# Patient Record
Sex: Female | Born: 1975 | Race: Asian | Hispanic: No | Marital: Married | Smoking: Never smoker
Health system: Southern US, Community
[De-identification: ages and names within clinical notes are randomized; demographics above are authoritative.]

## PROBLEM LIST (undated history)

## (undated) DIAGNOSIS — E282 Polycystic ovarian syndrome: Secondary | ICD-10-CM

## (undated) DIAGNOSIS — O24419 Gestational diabetes mellitus in pregnancy, unspecified control: Secondary | ICD-10-CM

## (undated) HISTORY — PX: OTHER SURGICAL HISTORY: SHX169

## (undated) HISTORY — DX: Polycystic ovarian syndrome: E28.2

## (undated) HISTORY — DX: Gestational diabetes mellitus in pregnancy, unspecified control: O24.419

---

## 2013-05-14 NOTE — L&D Delivery Note (Signed)
Delivery Note At 11:10 AM a viable and healthy female was delivered via Vaginal, Spontaneous Delivery (Presentation: ; [place] ).  APGAR: 8, 9; weight pending .   Placenta status: spontaneous, intact  Cord:  with the following complications: [place] x 2 reduced at delivery.  Cord pH: na  Anesthesia:  local Episiotomy: midline( done for fetal bradycardia) Lacerations: none Suture Repair: 2.0 vicryl Est. Blood Loss (mL): 300  Mom to postpartum.  Baby to Couplet care / Skin to Skin.  Roseann Kees J 02/11/2014, 11:37 AM

## 2013-07-22 LAB — OB RESULTS CONSOLE HEPATITIS B SURFACE ANTIGEN: HEP B S AG: NEGATIVE

## 2013-07-22 LAB — OB RESULTS CONSOLE RUBELLA ANTIBODY, IGM: RUBELLA: IMMUNE

## 2013-07-22 LAB — OB RESULTS CONSOLE GC/CHLAMYDIA
Chlamydia: NEGATIVE
Gonorrhea: NEGATIVE

## 2013-07-22 LAB — OB RESULTS CONSOLE RPR: RPR: NONREACTIVE

## 2013-07-22 LAB — OB RESULTS CONSOLE HIV ANTIBODY (ROUTINE TESTING): HIV: NONREACTIVE

## 2013-07-22 LAB — OB RESULTS CONSOLE ABO/RH: RH Type: POSITIVE

## 2013-07-22 LAB — OB RESULTS CONSOLE ANTIBODY SCREEN: Antibody Screen: NEGATIVE

## 2013-09-09 ENCOUNTER — Encounter: Payer: BC Managed Care – PPO | Attending: Obstetrics & Gynecology

## 2013-09-09 VITALS — Ht 62.5 in | Wt 146.5 lb

## 2013-09-09 DIAGNOSIS — O99810 Abnormal glucose complicating pregnancy: Secondary | ICD-10-CM | POA: Insufficient documentation

## 2013-09-09 DIAGNOSIS — Z713 Dietary counseling and surveillance: Secondary | ICD-10-CM | POA: Insufficient documentation

## 2013-09-10 NOTE — Progress Notes (Signed)
  Patient was seen on 09/09/13 for Gestational Diabetes self-management class at the [facility]. The following learning objectives were met by the patient during this course:   States the definition of Gestational Diabetes  States why dietary management is important in controlling blood glucose  Describes the effects of carbohydrates on blood glucose levels  Demonstrates ability to create a balanced meal plan  Demonstrates carbohydrate counting   States when to check blood glucose levels  Demonstrates proper blood glucose monitoring techniques  States the effect of stress and exercise on blood glucose levels  States the importance of limiting caffeine and abstaining from alcohol and smoking  Plan:  Aim for 2 Carb Choices per meal (30 grams) +/- 1 either way for breakfast Aim for 3 Carb Choices per meal (45 grams) +/- 1 either way from lunch and dinner Aim for 1-2 Carbs per snack Begin reading food labels for Total Carbohydrate and sugar grams of foods Consider  increasing your activity level by walking daily as tolerated Begin checking BG before breakfast and 1-2 hours after first bit of breakfast, lunch and dinner after  as directed by MD  Take medication  as directed by MD  Blood glucose monitor given:  One Touch Ultra Mini Self Monitoring Kit Lot # [id number] X Exp: 02/2014 Blood glucose reading: 53mdl  Patient instructed to monitor glucose levels: FBS: 60 - <90 2 hour: <120  Patient received the following handouts:  Nutrition Diabetes and Pregnancy  Carbohydrate Counting List  Meal Planning worksheet  Patient will be seen for follow-up as needed.

## 2013-10-26 ENCOUNTER — Inpatient Hospital Stay (HOSPITAL_COMMUNITY): Admission: AD | Admit: 2013-10-26 | Payer: Self-pay | Source: Ambulatory Visit | Admitting: Obstetrics & Gynecology

## 2014-01-12 LAB — OB RESULTS CONSOLE GBS: GBS: NEGATIVE

## 2014-01-13 ENCOUNTER — Other Ambulatory Visit: Payer: Self-pay | Admitting: Obstetrics & Gynecology

## 2014-01-27 ENCOUNTER — Inpatient Hospital Stay (HOSPITAL_COMMUNITY): Admission: RE | Admit: 2014-01-27 | Payer: BC Managed Care – PPO | Source: Ambulatory Visit

## 2014-01-29 ENCOUNTER — Inpatient Hospital Stay (HOSPITAL_COMMUNITY)
Admission: RE | Admit: 2014-01-29 | Payer: BC Managed Care – PPO | Source: Ambulatory Visit | Admitting: Obstetrics & Gynecology

## 2014-01-29 ENCOUNTER — Encounter (HOSPITAL_COMMUNITY): Admission: RE | Payer: Self-pay | Source: Ambulatory Visit

## 2014-01-29 SURGERY — Surgical Case
Anesthesia: Spinal

## 2014-02-03 ENCOUNTER — Encounter (HOSPITAL_COMMUNITY): Payer: Self-pay | Admitting: *Deleted

## 2014-02-03 ENCOUNTER — Telehealth (HOSPITAL_COMMUNITY): Payer: Self-pay | Admitting: *Deleted

## 2014-02-03 ENCOUNTER — Other Ambulatory Visit: Payer: Self-pay | Admitting: Obstetrics & Gynecology

## 2014-02-03 NOTE — Telephone Encounter (Signed)
Preadmission screen  

## 2014-02-09 NOTE — Telephone Encounter (Signed)
Interpreter number [phone]

## 2014-02-10 ENCOUNTER — Inpatient Hospital Stay (HOSPITAL_COMMUNITY): Payer: BC Managed Care – PPO

## 2014-02-10 ENCOUNTER — Inpatient Hospital Stay (HOSPITAL_COMMUNITY)
Admission: AD | Admit: 2014-02-10 | Discharge: 2014-02-10 | Disposition: A | Discharge status: Home / Self Care | Payer: BC Managed Care – PPO | Source: Ambulatory Visit | Attending: Obstetrics | Admitting: Obstetrics

## 2014-02-10 ENCOUNTER — Encounter (HOSPITAL_COMMUNITY): Payer: Self-pay | Admitting: *Deleted

## 2014-02-10 DIAGNOSIS — O99810 Abnormal glucose complicating pregnancy: Secondary | ICD-10-CM | POA: Diagnosis not present

## 2014-02-10 DIAGNOSIS — Z87891 Personal history of nicotine dependence: Secondary | ICD-10-CM | POA: Diagnosis not present

## 2014-02-10 DIAGNOSIS — O99891 Other specified diseases and conditions complicating pregnancy: Secondary | ICD-10-CM | POA: Insufficient documentation

## 2014-02-10 DIAGNOSIS — N898 Other specified noninflammatory disorders of vagina: Secondary | ICD-10-CM | POA: Insufficient documentation

## 2014-02-10 DIAGNOSIS — O09529 Supervision of elderly multigravida, unspecified trimester: Secondary | ICD-10-CM | POA: Diagnosis not present

## 2014-02-10 DIAGNOSIS — O9989 Other specified diseases and conditions complicating pregnancy, childbirth and the puerperium: Principal | ICD-10-CM

## 2014-02-10 DIAGNOSIS — O4690 Antepartum hemorrhage, unspecified, unspecified trimester: Secondary | ICD-10-CM

## 2014-02-10 NOTE — Discharge Instructions (Signed)
Braxton Hicks Contractions Contractions of the uterus can occur throughout pregnancy. Contractions are not always a sign that you are in labor.  WHAT ARE BRAXTON HICKS CONTRACTIONS?  Contractions that occur before labor are called Braxton Hicks contractions, or false labor. Toward the end of pregnancy (32-34 weeks), these contractions can develop more often and may become more forceful. This is not true labor because these contractions do not result in opening (dilatation) and thinning of the cervix. They are sometimes difficult to tell apart from true labor because these contractions can be forceful and people have different pain tolerances. You should not feel embarrassed if you go to the hospital with false labor. Sometimes, the only way to tell if you are in true labor is for your health care provider to look for changes in the cervix. If there are no prenatal problems or other health problems associated with the pregnancy, it is completely safe to be sent home with false labor and await the onset of true labor. HOW CAN YOU TELL THE DIFFERENCE BETWEEN TRUE AND FALSE LABOR? False Labor  The contractions of false labor are usually shorter and not as hard as those of true labor.   The contractions are usually irregular.   The contractions are often felt in the front of the lower abdomen and in the groin.   The contractions may go away when you walk around or change positions while lying down.   The contractions get weaker and are shorter lasting as time goes on.   The contractions do not usually become progressively stronger, regular, and closer together as with true labor.  True Labor  Contractions in true labor last 30-70 seconds, become very regular, usually become more intense, and increase in frequency.   The contractions do not go away with walking.   The discomfort is usually felt in the top of the uterus and spreads to the lower abdomen and low back.   True labor can be  determined by your health care provider with an exam. This will show that the cervix is dilating and getting thinner.  WHAT TO REMEMBER  Keep up with your usual exercises and follow other instructions given by your health care provider.   Take medicines as directed by your health care provider.   Keep your regular prenatal appointments.   Eat and drink lightly if you think you are going into labor.   If Braxton Hicks contractions are making you uncomfortable:   Change your position from lying down or resting to walking, or from walking to resting.   Sit and rest in a tub of warm water.   Drink 2-3 glasses of water. Dehydration may cause these contractions.   Do slow and deep breathing several times an hour.  WHEN SHOULD I SEEK IMMEDIATE MEDICAL CARE? Seek immediate medical care if:  Your contractions become stronger, more regular, and closer together.   You have fluid leaking or gushing from your vagina.   You have a fever.   You pass blood-tinged mucus.   You have vaginal bleeding.   You have continuous abdominal pain.   You have low back pain that you never had before.   You feel your baby's head pushing down and causing pelvic pressure.   Your baby is not moving as much as it used to.  Document Released: 04/30/2005 Document Revised: 05/05/2013 Document Reviewed: 02/09/2013 ExitCare Patient Information 2015 [employer]. This information is not intended to replace advice given to you by your health care   provider. Make sure you discuss any questions you have with your health care provider.  Fetal Movement Counts Patient Name: __________________________________________________ Patient Due Date: ____________________ Performing a fetal movement count is highly recommended in high-risk pregnancies, but it is good for every pregnant woman to do. Your health care provider may ask you to start counting fetal movements at 28 weeks of the pregnancy. Fetal  movements often increase:  After eating a full meal.  After physical activity.  After eating or drinking something sweet or cold.  At rest. Pay attention to when you feel the baby is most active. This will help you notice a pattern of your baby's sleep and wake cycles and what factors contribute to an increase in fetal movement. It is important to perform a fetal movement count at the same time each day when your baby is normally most active.  HOW TO COUNT FETAL MOVEMENTS 1. Find a quiet and comfortable area to sit or lie down on your left side. Lying on your left side provides the best blood and oxygen circulation to your baby. 2. Write down the day and time on a sheet of paper or in a journal. 3. Start counting kicks, flutters, swishes, rolls, or jabs in a 2-hour period. You should feel at least 10 movements within 2 hours. 4. If you do not feel 10 movements in 2 hours, wait 2-3 hours and count again. Look for a change in the pattern or not enough counts in 2 hours. SEEK MEDICAL CARE IF:  You feel less than 10 counts in 2 hours, tried twice.  There is no movement in over an hour.  The pattern is changing or taking longer each day to reach 10 counts in 2 hours.  You feel the baby is not moving as he or she usually does. Date: ____________ Movements: ____________ Start time: ____________ Finish time: ____________  Date: ____________ Movements: ____________ Start time: ____________ Finish time: ____________ Date: ____________ Movements: ____________ Start time: ____________ Finish time: ____________ Date: ____________ Movements: ____________ Start time: ____________ Finish time: ____________ Date: ____________ Movements: ____________ Start time: ____________ Finish time: ____________ Date: ____________ Movements: ____________ Start time: ____________ Finish time: ____________ Date: ____________ Movements: ____________ Start time: ____________ Finish time: ____________ Date: ____________  Movements: ____________ Start time: ____________ Finish time: ____________  Date: ____________ Movements: ____________ Start time: ____________ Finish time: ____________ Date: ____________ Movements: ____________ Start time: ____________ Finish time: ____________ Date: ____________ Movements: ____________ Start time: ____________ Finish time: ____________ Date: ____________ Movements: ____________ Start time: ____________ Finish time: ____________ Date: ____________ Movements: ____________ Start time: ____________ Finish time: ____________ Date: ____________ Movements: ____________ Start time: ____________ Finish time: ____________ Date: ____________ Movements: ____________ Start time: ____________ Finish time: ____________  Date: ____________ Movements: ____________ Start time: ____________ Finish time: ____________ Date: ____________ Movements: ____________ Start time: ____________ Finish time: ____________ Date: ____________ Movements: ____________ Start time: ____________ Finish time: ____________ Date: ____________ Movements: ____________ Start time: ____________ Finish time: ____________ Date: ____________ Movements: ____________ Start time: ____________ Finish time: ____________ Date: ____________ Movements: ____________ Start time: ____________ Finish time: ____________ Date: ____________ Movements: ____________ Start time: ____________ Finish time: ____________  Date: ____________ Movements: ____________ Start time: ____________ Finish time: ____________ Date: ____________ Movements: ____________ Start time: ____________ Finish time: ____________ Date: ____________ Movements: ____________ Start time: ____________ Finish time: ____________ Date: ____________ Movements: ____________ Start time: ____________ Finish time: ____________ Date: ____________ Movements: ____________ Start time: ____________ Finish time: ____________ Date: ____________ Movements: ____________ Start time:  ____________ Finish time: ____________ Date: ____________ Movements:   ____________ Start time: ____________ Finish time: ____________  Date: ____________ Movements: ____________ Start time: ____________ Finish time: ____________ Date: ____________ Movements: ____________ Start time: ____________ Finish time: ____________ Date: ____________ Movements: ____________ Start time: ____________ Finish time: ____________ Date: ____________ Movements: ____________ Start time: ____________ Finish time: ____________ Date: ____________ Movements: ____________ Start time: ____________ Finish time: ____________ Date: ____________ Movements: ____________ Start time: ____________ Finish time: ____________ Date: ____________ Movements: ____________ Start time: ____________ Finish time: ____________  Date: ____________ Movements: ____________ Start time: ____________ Finish time: ____________ Date: ____________ Movements: ____________ Start time: ____________ Finish time: ____________ Date: ____________ Movements: ____________ Start time: ____________ Finish time: ____________ Date: ____________ Movements: ____________ Start time: ____________ Finish time: ____________ Date: ____________ Movements: ____________ Start time: ____________ Finish time: ____________ Date: ____________ Movements: ____________ Start time: ____________ Finish time: ____________ Date: ____________ Movements: ____________ Start time: ____________ Finish time: ____________  Date: ____________ Movements: ____________ Start time: ____________ Finish time: ____________ Date: ____________ Movements: ____________ Start time: ____________ Finish time: ____________ Date: ____________ Movements: ____________ Start time: ____________ Finish time: ____________ Date: ____________ Movements: ____________ Start time: ____________ Finish time: ____________ Date: ____________ Movements: ____________ Start time: ____________ Finish time: ____________ Date:  ____________ Movements: ____________ Start time: ____________ Finish time: ____________ Date: ____________ Movements: ____________ Start time: ____________ Finish time: ____________  Date: ____________ Movements: ____________ Start time: ____________ Finish time: ____________ Date: ____________ Movements: ____________ Start time: ____________ Finish time: ____________ Date: ____________ Movements: ____________ Start time: ____________ Finish time: ____________ Date: ____________ Movements: ____________ Start time: ____________ Finish time: ____________ Date: ____________ Movements: ____________ Start time: ____________ Finish time: ____________ Date: ____________ Movements: ____________ Start time: ____________ Finish time: ____________ Document Released: 05/30/2006 Document Revised: 09/14/2013 Document Reviewed: 02/25/2012 ExitCare Patient Information 2015 [employer]. This information is not intended to replace advice given to you by your health care provider. Make sure you discuss any questions you have with your health care provider.  

## 2014-02-10 NOTE — MAU Provider Note (Addendum)
  History     CSN: [id number]  Arrival date and time: 02/10/14 0011   None     Chief Complaint  Patient presents with  . Vaginal Bleeding   HPI 38 yo, G1 at 39.3 wks, here for UCs, noted fluid like blood stained vaginal discharge when started to get out for a walk after dinner. UCs are mild and regular. Bleeding noted per RN on exam that was more than bloody show AMA, PCOS, several rounds of ovulation medication A2GDM on low dose Glyburide Placenta previa that resolved at 36 wk sono, so scheduled C/s was cancelled.    Past Medical History  Diagnosis Date  . Gestational diabetes mellitus, antepartum   . PCOS (polycystic ovarian syndrome)   . Gestational diabetes     Past Surgical History  Procedure Laterality Date  . Hysterosalpingogram    . Uterine biopsy      History reviewed. No pertinent family history.  History  Substance Use Topics  . Smoking status: Not on file  . Smokeless tobacco: Not on file  . Alcohol Use: Not on file    Allergies:  Allergies  Allergen Reactions  . Shellfish Allergy     Prescriptions prior to admission  Medication Sig Dispense Refill  . Prenatal Multivit-Min-Fe-FA (PRENATAL VITAMINS PO) Take 1 each by mouth.        ROS Physical Exam   Blood pressure 118/72, pulse 85, temperature 98.4 F (36.9 C), temperature source Oral, resp. rate 18, height 5' 2.5" (1.588 m), weight 159 lb (72.122 kg), last menstrual period 05/10/2013, SpO2 100.00%.  Physical Exam Physical exam:  A&O x 3, no acute distress. Pleasant Abdo soft, non tender, non acute, relaxed gravid uterus  Extr no edema/ tenderness Pelvic 1/ thick/ high station- no reached per vagina per RN in MAU and blood stained glove FHT  140s/ + accels/ no decels/ mod variab Toco irritability vs early labor UCs  MAU Course  Procedures Pelvic sono -  Assessment and Plan  Pt showed me a photo of blood stained fluid she had in underwear that appears to be amniotic fluid, so plan to  check AFI and placenta since vag exam noted blood and no fluid.   AFI 15 cm, placental edge above the cervical os, reassuring. Labor precautions, FAC. IOL 10/2.   Marcia Hartwell R 02/10/2014, 2:30 AM

## 2014-02-10 NOTE — MAU Note (Signed)
Pt reports vaginal bleeding since 2220,

## 2014-02-10 NOTE — MAU Note (Signed)
Pt states she felt some fluid' blood tinge come out at 1018pm. Pt states she has been feeling abdomen getting tightening

## 2014-02-11 ENCOUNTER — Encounter (HOSPITAL_COMMUNITY): Payer: Self-pay | Admitting: *Deleted

## 2014-02-11 ENCOUNTER — Inpatient Hospital Stay (HOSPITAL_COMMUNITY)
Admission: AD | Admit: 2014-02-11 | Discharge: 2014-02-13 | DRG: 774 | Disposition: A | Discharge status: Home / Self Care | Payer: BC Managed Care – PPO | Source: Ambulatory Visit | Attending: Obstetrics and Gynecology | Admitting: Obstetrics and Gynecology

## 2014-02-11 DIAGNOSIS — O2412 Pre-existing diabetes mellitus, type 2, in childbirth: Principal | ICD-10-CM | POA: Diagnosis present

## 2014-02-11 DIAGNOSIS — E119 Type 2 diabetes mellitus without complications: Secondary | ICD-10-CM | POA: Diagnosis present

## 2014-02-11 DIAGNOSIS — Z3A39 39 weeks gestation of pregnancy: Secondary | ICD-10-CM | POA: Diagnosis present

## 2014-02-11 MED ORDER — ONDANSETRON HCL 4 MG/2ML IJ SOLN
4.0000 mg | INTRAMUSCULAR | Status: DC | PRN
Start: 2014-02-11 — End: 2014-02-13

## 2014-02-11 MED ORDER — PRENATAL MULTIVITAMIN CH
1.0000 | ORAL_TABLET | Freq: Every day | ORAL | Status: DC
  Administered 2014-02-11 – 2014-02-13 (×3): 1 via ORAL
  Filled 2014-02-11 (×4): qty 1

## 2014-02-11 MED ORDER — ONDANSETRON HCL 4 MG PO TABS
4.0000 mg | ORAL_TABLET | ORAL | Status: DC | PRN
Start: 2014-02-11 — End: 2014-02-13

## 2014-02-11 MED ORDER — WITCH HAZEL-GLYCERIN EX PADS
1.0000 "application " | MEDICATED_PAD | CUTANEOUS | Status: DC | PRN
  Administered 2014-02-11: 1 via TOPICAL

## 2014-02-11 MED ORDER — SENNOSIDES-DOCUSATE SODIUM 8.6-50 MG PO TABS
2.0000 | ORAL_TABLET | ORAL | Status: DC
  Administered 2014-02-12 (×2): 2 via ORAL
  Filled 2014-02-11 (×2): qty 2

## 2014-02-11 MED ORDER — OXYCODONE-ACETAMINOPHEN 5-325 MG PO TABS
1.0000 | ORAL_TABLET | ORAL | Status: DC | PRN
  Administered 2014-02-12 – 2014-02-13 (×3): 1 via ORAL
  Filled 2014-02-11 (×3): qty 1

## 2014-02-11 MED ORDER — SIMETHICONE 80 MG PO CHEW
80.0000 mg | CHEWABLE_TABLET | ORAL | Status: DC | PRN
Start: 2014-02-11 — End: 2014-02-13
  Filled 2014-02-11: qty 1

## 2014-02-11 MED ORDER — LIDOCAINE HCL (PF) 1 % IJ SOLN
INTRAMUSCULAR | Status: AC
  Filled 2014-02-11: qty 30

## 2014-02-11 MED ORDER — DIPHENHYDRAMINE HCL 25 MG PO CAPS
25.0000 mg | ORAL_CAPSULE | Freq: Four times a day (QID) | ORAL | Status: DC | PRN
Start: 2014-02-11 — End: 2014-02-13

## 2014-02-11 MED ORDER — LANOLIN HYDROUS EX OINT: TOPICAL_OINTMENT | CUTANEOUS | Status: DC | PRN

## 2014-02-11 MED ORDER — BENZOCAINE-MENTHOL 20-0.5 % EX AERO
1.0000 "application " | INHALATION_SPRAY | CUTANEOUS | Status: DC | PRN
  Administered 2014-02-11: 1 via TOPICAL
  Filled 2014-02-11 (×3): qty 56

## 2014-02-11 MED ORDER — OXYTOCIN 40 UNITS IN LACTATED RINGERS INFUSION - SIMPLE MED
INTRAVENOUS | Status: AC
  Filled 2014-02-11: qty 1000

## 2014-02-11 MED ORDER — METHYLERGONOVINE MALEATE 0.2 MG PO TABS: 0.2000 mg | ORAL_TABLET | ORAL | Status: DC | PRN

## 2014-02-11 MED ORDER — IBUPROFEN 600 MG PO TABS
600.0000 mg | ORAL_TABLET | Freq: Four times a day (QID) | ORAL | Status: DC
  Administered 2014-02-11 – 2014-02-13 (×8): 600 mg via ORAL
  Filled 2014-02-11 (×8): qty 1

## 2014-02-11 MED ORDER — ZOLPIDEM TARTRATE 5 MG PO TABS: 5.0000 mg | ORAL_TABLET | Freq: Every evening | ORAL | Status: DC | PRN

## 2014-02-11 MED ORDER — OXYCODONE-ACETAMINOPHEN 5-325 MG PO TABS: 2.0000 | ORAL_TABLET | ORAL | Status: DC | PRN

## 2014-02-11 MED ORDER — OXYTOCIN 10 UNIT/ML IJ SOLN
INTRAMUSCULAR | Status: AC
  Administered 2014-02-11: 10 [IU]
  Filled 2014-02-11: qty 1

## 2014-02-11 MED ORDER — OXYTOCIN 40 UNITS IN LACTATED RINGERS INFUSION - SIMPLE MED
250.0000 mL/h | Freq: Once | INTRAVENOUS | Status: AC
  Administered 2014-02-11: 250 mL/h via INTRAVENOUS

## 2014-02-11 MED ORDER — TETANUS-DIPHTH-ACELL PERTUSSIS 5-2.5-18.5 LF-MCG/0.5 IM SUSP: 0.5000 mL | Freq: Once | INTRAMUSCULAR | Status: DC

## 2014-02-11 MED ORDER — DIBUCAINE 1 % RE OINT
1.0000 | TOPICAL_OINTMENT | RECTAL | Status: DC | PRN
Start: 2014-02-11 — End: 2014-02-13
  Administered 2014-02-12: 1 via RECTAL
  Filled 2014-02-11 (×2): qty 28

## 2014-02-11 MED ORDER — METHYLERGONOVINE MALEATE 0.2 MG/ML IJ SOLN: 0.2000 mg | INTRAMUSCULAR | Status: DC | PRN

## 2014-02-11 MED ORDER — INFLUENZA VAC SPLIT QUAD 0.5 ML IM SUSY
0.5000 mL | PREFILLED_SYRINGE | INTRAMUSCULAR | Status: AC
  Administered 2014-02-12: 0.5 mL via INTRAMUSCULAR

## 2014-02-11 NOTE — H&P (Signed)
Misty Chang is a 38 y.o. female presenting for labor. Maternal Medical History:  Reason for admission: Contractions.   Contractions: Onset was 1-2 hours ago.   Frequency: regular.   Perceived severity is moderate.    Fetal activity: Perceived fetal activity is normal.   Last perceived fetal movement was within the past hour.    Prenatal complications: no prenatal complications Prenatal Complications - Diabetes: type 2. Diabetes is managed by oral agent (triple therapy).      OB History   Grav Para Term Preterm Abortions TAB SAB Ect Mult Living   1              Past Medical History  Diagnosis Date  . Gestational diabetes mellitus, antepartum   . PCOS (polycystic ovarian syndrome)   . Gestational diabetes    Past Surgical History  Procedure Laterality Date  . Hysterosalpingogram    . Uterine biopsy     Family History: family history is not on file. Social History:  has no tobacco, alcohol, and drug history on file.   Prenatal Transfer Tool  Maternal Diabetes: Yes:  Diabetes Type:  Insulin/Medication controlled Genetic Screening: Normal Maternal Ultrasounds/Referrals: Normal Fetal Ultrasounds or other Referrals:  None Maternal Substance Abuse:  Yes:  Type: Other: none Significant Maternal Medications:  Meds include: Other: glyburide Significant Maternal Lab Results:  None Other Comments:  None  Review of Systems  Unable to perform ROS All other systems reviewed and are negative.   Dilation: 10 Station: Crowning Exam by:: K.wilson Last menstrual period 05/10/2013. Maternal Exam:  Uterine Assessment: Contraction strength is firm.  Contraction frequency is regular.   Abdomen: Patient reports no abdominal tenderness. Fetal presentation: vertex  Introitus: Normal vulva. Normal vagina.  Ferning test: positive.  Nitrazine test: positive. Amniotic fluid character: meconium stained.  Pelvis: adequate for delivery.   Cervix: Cervix evaluated by digital exam.      Physical Exam  Nursing note and vitals reviewed. Constitutional: She is oriented to person, place, and time. She appears well-developed and well-nourished.  HENT:  Head: Normocephalic and atraumatic.  Cardiovascular: Normal rate and regular rhythm.   Respiratory: Effort normal and breath sounds normal.  GI: Soft. Bowel sounds are normal.  Genitourinary: Vagina normal and uterus normal.  Musculoskeletal: She exhibits edema.  Neurological: She is alert and oriented to person, place, and time.  Skin: Skin is warm and dry.    Prenatal labs: ABO, Rh: B/Positive/-- (03/11 0000) Antibody: Negative (03/11 0000) Rubella: Immune (03/11 0000) RPR: Nonreactive (03/11 0000)  HBsAg: Negative (03/11 0000)  HIV: Non-reactive (03/11 0000)  GBS: Negative (09/01 0000)   Assessment/Plan: Term iup in active labor SVD A2DM   Kaileb Monsanto J 02/11/2014, 11:33 AM

## 2014-02-11 NOTE — MAU Note (Signed)
ems arriaval c/ ctx' very uncomfortable. Leaking  Pink to yellow colored fluid. Placed on bed and applied monitor. SVE complete +1 statin. Provider called and delivery table set up

## 2014-02-11 NOTE — Lactation Note (Signed)
This note was copied from the chart of Misty Chang. Lactation Consultation Note  Patient Name: Misty Chang Reason for consult: Initial assessment;Difficult latch;Other (Comment) (mother GDM, baby has low temp and is STS) RN from Nursery had requested LC to assist with latch.  Baby is asleep and STS but arouses easily and no tremors noted.  Mom is primipara.  She and FOB are watching the breastfeeding newborn channel video and attempting to breastfeed their newborn. Hand expression demonstrated with some flow noted prior to latch, then baby does latch after multiple attempts and finally sustaines latch for total of 5 minutes with swallows noted. For first 10 minutes, latches were brief with a few swallows noted.   LC encouraged frequent STS, discussed minimum feeding frequency and duration but encouraged cue feedings ad lib.  Mom encouraged to feed baby 8-12 times/24 hours and with feeding cues. LC encouraged review of Baby and Me pp 9, 14 and 20-25 for STS and BF information. LC provided [employer] Resource brochure and reviewed [facility] and list of community and web site resources.    Maternal Data Formula Feeding for Exclusion: No Has patient been taught Hand Expression?: Yes (per LC) Does the patient have breastfeeding experience prior to this delivery?: No (primipara)  Feeding Feeding Type: Breast Fed Length of feed: 15 min  LATCH Score/Interventions Latch: Repeated attempts needed to sustain latch, nipple held in mouth throughout feeding, stimulation needed to elicit sucking reflex. (finally sustained latch for >5 minutes after 10 minutes on/off) Intervention(s): Adjust position;Assist with latch;Breast compression  Audible Swallowing: Spontaneous and intermittent  Type of Nipple: Everted at rest and after stimulation  Comfort (Breast/Nipple): Soft / non-tender     Hold (Positioning): Assistance needed to correctly position infant at breast and maintain  latch. Intervention(s): Breastfeeding basics reviewed;Support Pillows;Position options;Skin to skin  LATCH Score: 8 (with LC assistance and observation)  Lactation Tools Discussed/Used   STS, hand expression, cue feedings Typical newborn feeding frequency and duration and signs of intake based on baby's output  Consult Status Consult Status: Follow-up Date: 02/12/14 Follow-up type: In-patient    Warrick ParisianBryant, Misty Chang Chang, 6:18 PM

## 2014-02-12 ENCOUNTER — Inpatient Hospital Stay (HOSPITAL_COMMUNITY): Admission: RE | Admit: 2014-02-12 | Payer: BC Managed Care – PPO | Source: Ambulatory Visit

## 2014-02-12 LAB — CBC
HCT: 30 % — ABNORMAL LOW (ref 36.0–46.0)
HEMOGLOBIN: 10.3 g/dL — AB (ref 12.0–15.0)
MCH: 31.2 pg (ref 26.0–34.0)
MCHC: 34.3 g/dL (ref 30.0–36.0)
MCV: 90.9 fL (ref 78.0–100.0)
Platelets: 139 10*3/uL — ABNORMAL LOW (ref 150–400)
RBC: 3.3 MIL/uL — ABNORMAL LOW (ref 3.87–5.11)
RDW: 14 % (ref 11.5–15.5)
WBC: 11.9 10*3/uL — AB (ref 4.0–10.5)

## 2014-02-12 LAB — RPR

## 2014-02-12 NOTE — Lactation Note (Signed)
This note was copied from the chart of Misty Chang. Lactation Consultation Note  Patient Name: Misty Dahlia BailiffFarhat Ayo VHQIO'NToday's Date: 02/12/2014 Reason for consult: Follow-up assessment Baby 26 hours of life. Called by patient's nurse to assist with difficult latch. Assisted mom to latch baby to right breast in football position. Demonstrated how to suck-train baby with LC's gloved finger. Hand expressed colostrum and enticed baby to open mouth widely. Baby latched well, suckling rhythmically with a few swallows noted. Mom fell asleep while baby suckling and could not compress her own breast. Woke her up and discussed the steps to latching baby, reviewed with FOB as well. Enc mom to latch baby again after a nap.   Maternal Data    Feeding Feeding Type: Breast Fed Length of feed: 3 min (Baby able to latch with breasts compressed, mom fell asleep while baby nursing. Enc to try again later.)  LATCH Score/Interventions Latch: Repeated attempts needed to sustain latch, nipple held in mouth throughout feeding, stimulation needed to elicit sucking reflex. Intervention(s): Teach feeding cues;Waking techniques Intervention(s): Adjust position;Assist with latch;Breast compression  Audible Swallowing: A few with stimulation Intervention(s): Skin to skin;Hand expression  Type of Nipple: Flat (using hand pump to evert nipples. )  Comfort (Breast/Nipple): Soft / non-tender     Hold (Positioning): Assistance needed to correctly position infant at breast and maintain latch. Intervention(s): Support Pillows;Breastfeeding basics reviewed  LATCH Score: 6  Lactation Tools Discussed/Used     Consult Status Consult Status: PRN Date: 02/12/14 Follow-up type: In-patient    Geralynn OchsWILLIARD, Yamilex Borgwardt 02/12/2014, 1:29 PM

## 2014-02-12 NOTE — Progress Notes (Signed)
Patient ID: Misty Chang, female   DOB: 11/01/1975, 38 y.o.   MRN: [id number] PPD # 1 SVD  S:  Reports feeling a little sore, but well             Tolerating po/ No nausea or vomiting             Bleeding is light             Pain controlled with ibuprofen (OTC)             Up ad lib / ambulatory / voiding without difficulties    Newborn  Information for the patient's newborn:  Misty Chang, Boy Misty Chang [[id number]][[id number]]  female  breast feeding  / Circumcision planning   O:  A & O x 3, in no apparent distress              VS:  Filed Vitals:   02/11/14 1400 02/11/14 1732 02/12/14 0045 02/12/14 0601  BP: 102/47 106/57 104/60 94/53  Pulse: 76 78 86 76  Temp: 98.4 F (36.9 C) 98.7 F (37.1 C) 99.1 F (37.3 C) 97.5 F (36.4 C)  TempSrc: Oral Oral Oral Oral  Resp: 16 16 18 19   SpO2:    97%    LABS:  Recent Labs  02/12/14 0600  WBC 11.9*  HGB 10.3*  HCT 30.0*  PLT 139*    Blood type: B/Positive/-- (03/11 0000)  Rubella: Immune (03/11 0000)   I&O: I/O last 3 completed shifts: In: -  Out: 300 [Blood:300]             Lungs: Clear and unlabored  Heart: regular rate and rhythm / no murmurs  Abdomen: soft, non-tender, non-distended              Fundus: firm, non-tender, U-2  Perineum: episiotomy repair healing well; mild edema - ice pack in place  Lochia: minimal  Extremities: no edema, no calf pain or tenderness, no Homans    A/P: PPD # 1  38 y.o., G1P1001   Principal Problem:    Postpartum care following vaginal delivery (10/1)    Doing well - stable status  Routine post partum orders  Anticipate discharge tomorrow    Misty MoraAWSON, Lamarco Gudiel, M, MSN, CNM 02/12/2014, 9:44 AM

## 2014-02-13 MED ORDER — OXYCODONE-ACETAMINOPHEN 5-325 MG PO TABS: 1.0000 | ORAL_TABLET | ORAL | Status: DC | PRN

## 2014-02-13 MED ORDER — IBUPROFEN 600 MG PO TABS: 600.0000 mg | ORAL_TABLET | Freq: Four times a day (QID) | ORAL | Status: DC

## 2014-02-13 NOTE — Progress Notes (Signed)
PPD 2 SVD  Did not awaken @ 0915 for rounds - returned for visit @ 1115  S:  Reports feeling tired - but doing ok             Tolerating po/ No nausea or vomiting             Bleeding is light             Pain controlled with motrin and percocet sometimes             Up ad lib / ambulatory / voiding QS  Newborn breast feeding with some formula supplementation   O:               VS: BP 106/57  Pulse 67  Temp(Src) 97.7 F (36.5 C) (Oral)  Resp 18  SpO2 97%  LMP 05/10/2013  Breastfeeding? Unknown   LABS:              Recent Labs  02/12/14 0600  WBC 11.9*  HGB 10.3*  PLT 139*               Blood type: B/Positive/-- (03/11 0000)  Rubella: Immune (03/11 0000)                                Physical Exam:             Alert and oriented X3  Abdomen: soft, non-tender, non-distended              Fundus: firm, non-tender, U-2  Perineum: no edema  Lochia: light  Extremities: no edema, no calf pain or tenderness  A: PPD # 2             PCOS with pre-existing glucose intolerance (metformin) / GDM-A2 (glyberide)   Doing well - stable status  P: Routine post partum orders  DC home             Monitor BS at home - OV 2 weeks to review BS / decision to restart metformin at that time if elevated  Marlinda MikeBAILEY, TANYA CNM, MSN, [facility] 02/13/2014, 9:50 AM

## 2014-02-13 NOTE — Discharge Summary (Signed)
Reviewed and agree with note and plan. V.Raivyn Kabler, MD  

## 2014-02-13 NOTE — Discharge Summary (Signed)
Obstetric Discharge Summary  Reason for Admission: onset of labor Prenatal Procedures: NST and ultrasound Intrapartum Procedures: spontaneous vaginal delivery and episiotomy ML for bradycardia Postpartum Procedures: none Complications-Operative and Postpartum: 2nd degree episiotomy repair Hemoglobin  Date Value Ref Range Status  02/12/2014 10.3* 12.0 - 15.0 g/dL Final     HCT  Date Value Ref Range Status  02/12/2014 30.0* 36.0 - 46.0 % Final    Physical Exam:  General: alert, cooperative and no distress Lochia: appropriate Uterine Fundus: firm Incision: healing well DVT Evaluation: No evidence of DVT seen on physical exam.  Discharge Diagnoses: Term Pregnancy-delivered  / GDM-a2 with hx PCOS and glucose intolerance pre-existing  Discharge Information: Date: 02/13/2014 Activity: pelvic rest Diet: low carb Medications: PNV, Ibuprofen, Colace, Iron and Percocet Condition: stable Instructions: refer to practice specific booklet                         monitor FBS and 2 hr post largest meal x 2 weeks - bring log to 2 week visit                        Cal if any BS over 200 prior to next visit  Discharge to: home  Follow-up Information   Follow up with MODY,VAISHALI R, MD. Schedule an appointment as soon as possible for a visit in 2 weeks.   Specialty:  Obstetrics and Gynecology   Contact information:   Darla Les[address] [place] [address] [phone]     2 week interval visit for lactation and BS review  Newborn Data: Live born female  Birth Weight: 6 lb 6.3 oz (2900 g) APGAR: 8, 9  Home with mother.  Marlinda MikeBAILEY, Leelah Hanna 02/13/2014, 11:28 AM

## 2014-02-13 NOTE — Discharge Instructions (Signed)
°  Monitor fasting glucose levels and 2 hours after largest meal of day - keep log of blood sugar results Appointment in 2 weeks to review blood sugar levels and decide if needs to restart Metformin at that time  Call if any blood sugar over 200

## 2014-03-15 ENCOUNTER — Encounter (HOSPITAL_COMMUNITY): Payer: Self-pay | Admitting: *Deleted

## 2014-05-14 NOTE — L&D Delivery Note (Signed)
Delivery Note At 6:48 PM a healthy female was delivered via Vaginal, Spontaneous Delivery (Presentation: Middle Occiput Anterior).  APGAR: 8, 9; weight pending.   Placenta status: Intact, Spontaneous.  Cord: 3 vessels with the following complications: None.  Cord pH: N/A  Anesthesia: None  Episiotomy: None Lacerations: 2nd degree;Perineal Suture Repair: 2.0 3.0 vicryl Est. Blood Loss (mL):  300  Mom to postpartum.  Baby to Couplet care / Skin to Skin.  Misty Chang,[name] 05/06/2015, 7:16 PM

## 2014-10-13 LAB — OB RESULTS CONSOLE RPR: RPR: NONREACTIVE

## 2014-10-13 LAB — OB RESULTS CONSOLE HIV ANTIBODY (ROUTINE TESTING): HIV: NONREACTIVE

## 2015-04-12 LAB — OB RESULTS CONSOLE GBS: STREP GROUP B AG: NEGATIVE

## 2015-05-06 ENCOUNTER — Encounter (HOSPITAL_COMMUNITY): Payer: Self-pay | Admitting: *Deleted

## 2015-05-06 ENCOUNTER — Inpatient Hospital Stay (HOSPITAL_COMMUNITY)
Admission: AD | Admit: 2015-05-06 | Discharge: 2015-05-08 | DRG: 775 | Disposition: A | Discharge status: Home / Self Care | Payer: 59 | Source: Ambulatory Visit | Attending: Obstetrics & Gynecology | Admitting: Obstetrics & Gynecology

## 2015-05-06 DIAGNOSIS — O09523 Supervision of elderly multigravida, third trimester: Secondary | ICD-10-CM | POA: Diagnosis not present

## 2015-05-06 DIAGNOSIS — Z3A39 39 weeks gestation of pregnancy: Secondary | ICD-10-CM

## 2015-05-06 DIAGNOSIS — IMO0001 Reserved for inherently not codable concepts without codable children: Secondary | ICD-10-CM

## 2015-05-06 DIAGNOSIS — O24429 Gestational diabetes mellitus in childbirth, unspecified control: Secondary | ICD-10-CM | POA: Diagnosis not present

## 2015-05-06 DIAGNOSIS — Z8249 Family history of ischemic heart disease and other diseases of the circulatory system: Secondary | ICD-10-CM

## 2015-05-06 DIAGNOSIS — O24410 Gestational diabetes mellitus in pregnancy, diet controlled: Secondary | ICD-10-CM | POA: Diagnosis present

## 2015-05-06 LAB — CBC
HEMATOCRIT: 38.6 % (ref 36.0–46.0)
HEMOGLOBIN: 13.3 g/dL (ref 12.0–15.0)
MCH: 30.6 pg (ref 26.0–34.0)
MCHC: 34.5 g/dL (ref 30.0–36.0)
MCV: 88.9 fL (ref 78.0–100.0)
PLATELETS: 149 10*3/uL — AB (ref 150–400)
RBC: 4.34 MIL/uL (ref 3.87–5.11)
RDW: 13.8 % (ref 11.5–15.5)
WBC: 9.3 10*3/uL (ref 4.0–10.5)

## 2015-05-06 LAB — TYPE AND SCREEN
ABO/RH(D): B POS
Antibody Screen: NEGATIVE

## 2015-05-06 LAB — ABO/RH: ABO/RH(D): B POS

## 2015-05-06 MED ORDER — FLEET ENEMA 7-19 GM/118ML RE ENEM: 1.0000 | ENEMA | RECTAL | Status: DC | PRN

## 2015-05-06 MED ORDER — LANOLIN HYDROUS EX OINT: TOPICAL_OINTMENT | CUTANEOUS | Status: DC | PRN

## 2015-05-06 MED ORDER — OXYTOCIN 40 UNITS IN LACTATED RINGERS INFUSION - SIMPLE MED
1.0000 m[IU]/min | INTRAVENOUS | Status: DC
  Administered 2015-05-06: 2 m[IU]/min via INTRAVENOUS
  Filled 2015-05-06: qty 1000

## 2015-05-06 MED ORDER — WITCH HAZEL-GLYCERIN EX PADS
1.0000 "application " | MEDICATED_PAD | CUTANEOUS | Status: DC | PRN
  Administered 2015-05-06: 1 via TOPICAL

## 2015-05-06 MED ORDER — IBUPROFEN 600 MG PO TABS
600.0000 mg | ORAL_TABLET | Freq: Four times a day (QID) | ORAL | Status: DC
  Administered 2015-05-06 – 2015-05-08 (×8): 600 mg via ORAL
  Filled 2015-05-06 (×8): qty 1

## 2015-05-06 MED ORDER — OXYCODONE-ACETAMINOPHEN 5-325 MG PO TABS: 2.0000 | ORAL_TABLET | ORAL | Status: DC | PRN

## 2015-05-06 MED ORDER — TERBUTALINE SULFATE 1 MG/ML IJ SOLN: 0.2500 mg | Freq: Once | INTRAMUSCULAR | Status: DC | PRN

## 2015-05-06 MED ORDER — CITRIC ACID-SODIUM CITRATE 334-500 MG/5ML PO SOLN: 30.0000 mL | ORAL | Status: DC | PRN

## 2015-05-06 MED ORDER — ONDANSETRON HCL 4 MG PO TABS: 4.0000 mg | ORAL_TABLET | ORAL | Status: DC | PRN

## 2015-05-06 MED ORDER — ONDANSETRON HCL 4 MG/2ML IJ SOLN: 4.0000 mg | INTRAMUSCULAR | Status: DC | PRN

## 2015-05-06 MED ORDER — PRENATAL MULTIVITAMIN CH
1.0000 | ORAL_TABLET | Freq: Every day | ORAL | Status: DC
  Administered 2015-05-07 – 2015-05-08 (×2): 1 via ORAL
  Filled 2015-05-06 (×2): qty 1

## 2015-05-06 MED ORDER — OXYTOCIN 40 UNITS IN LACTATED RINGERS INFUSION - SIMPLE MED: 62.5000 mL/h | INTRAVENOUS | Status: DC | PRN

## 2015-05-06 MED ORDER — SENNOSIDES-DOCUSATE SODIUM 8.6-50 MG PO TABS
2.0000 | ORAL_TABLET | ORAL | Status: DC
  Administered 2015-05-06 – 2015-05-07 (×2): 2 via ORAL
  Filled 2015-05-06 (×2): qty 2

## 2015-05-06 MED ORDER — ACETAMINOPHEN 325 MG PO TABS: 650.0000 mg | ORAL_TABLET | ORAL | Status: DC | PRN

## 2015-05-06 MED ORDER — DIBUCAINE 1 % RE OINT
1.0000 "application " | TOPICAL_OINTMENT | RECTAL | Status: DC | PRN
  Administered 2015-05-06 – 2015-05-08 (×2): 1 via RECTAL
  Filled 2015-05-06 (×2): qty 28

## 2015-05-06 MED ORDER — OXYTOCIN 40 UNITS IN LACTATED RINGERS INFUSION - SIMPLE MED
62.5000 mL/h | INTRAVENOUS | Status: DC
  Administered 2015-05-06: 999 mL/h via INTRAVENOUS

## 2015-05-06 MED ORDER — TETANUS-DIPHTH-ACELL PERTUSSIS 5-2.5-18.5 LF-MCG/0.5 IM SUSP: 0.5000 mL | Freq: Once | INTRAMUSCULAR | Status: DC

## 2015-05-06 MED ORDER — SIMETHICONE 80 MG PO CHEW: 80.0000 mg | CHEWABLE_TABLET | ORAL | Status: DC | PRN

## 2015-05-06 MED ORDER — OXYTOCIN BOLUS FROM INFUSION: 500.0000 mL | INTRAVENOUS | Status: DC

## 2015-05-06 MED ORDER — OXYCODONE-ACETAMINOPHEN 5-325 MG PO TABS: 1.0000 | ORAL_TABLET | ORAL | Status: DC | PRN

## 2015-05-06 MED ORDER — LACTATED RINGERS IV SOLN
INTRAVENOUS | Status: DC
  Administered 2015-05-06: 16:00:00 via INTRAVENOUS

## 2015-05-06 MED ORDER — DIPHENHYDRAMINE HCL 25 MG PO CAPS: 25.0000 mg | ORAL_CAPSULE | Freq: Four times a day (QID) | ORAL | Status: DC | PRN

## 2015-05-06 MED ORDER — ONDANSETRON HCL 4 MG/2ML IJ SOLN: 4.0000 mg | Freq: Four times a day (QID) | INTRAMUSCULAR | Status: DC | PRN

## 2015-05-06 MED ORDER — LIDOCAINE HCL (PF) 1 % IJ SOLN
30.0000 mL | INTRAMUSCULAR | Status: AC | PRN
  Administered 2015-05-06: 30 mL via SUBCUTANEOUS
  Filled 2015-05-06: qty 30

## 2015-05-06 MED ORDER — BENZOCAINE-MENTHOL 20-0.5 % EX AERO
1.0000 "application " | INHALATION_SPRAY | CUTANEOUS | Status: DC | PRN
  Administered 2015-05-06 – 2015-05-08 (×3): 1 via TOPICAL
  Filled 2015-05-06 (×3): qty 56

## 2015-05-06 MED ORDER — LACTATED RINGERS IV SOLN: 500.0000 mL | INTRAVENOUS | Status: DC | PRN

## 2015-05-06 MED ORDER — ZOLPIDEM TARTRATE 5 MG PO TABS: 5.0000 mg | ORAL_TABLET | Freq: Every evening | ORAL | Status: DC | PRN

## 2015-05-06 NOTE — MAU Note (Signed)
C/o ?SROM @ 1000 today;

## 2015-05-06 NOTE — H&P (Signed)
Misty Chang is a 39 y.o. female G2P1001 6840w0d presenting for SROM.  RP:  Clear AF leak, irregular UCs.  OB History    Gravida Para Term Preterm AB TAB SAB Ectopic Multiple Living   2 1 1       1      Past Medical History  Diagnosis Date  . Gestational diabetes mellitus, antepartum   . PCOS (polycystic ovarian syndrome)   . Gestational diabetes    Past Surgical History  Procedure Laterality Date  . Hysterosalpingogram    . Uterine biopsy     Family History: family history includes Hypertension in her mother. There is no history of Alcohol abuse, Arthritis, Asthma, Birth defects, Cancer, COPD, Depression, Diabetes, Drug abuse, Early death, Hearing loss, Heart disease, Hyperlipidemia, Kidney disease, Learning disabilities, Mental illness, Mental retardation, Miscarriages / Stillbirths, Stroke, Vision loss, or Varicose Veins. Social History:  reports that she has never smoked. She does not have any smokeless tobacco history on file. She reports that she does not drink alcohol or use illicit drugs.  Allergies  Allergen Reactions  . Shellfish Allergy Itching    Swelling of lips    Dilation: 1 Effacement (%): 50 Station: -2 Exam by:: Misty Chang   Blood pressure 120/54, pulse 98, temperature 98.1 F (36.7 C), temperature source Oral, resp. rate 16, height 5\' 2"  (1.575 m), weight 164 lb (74.39 kg), unknown if currently breastfeeding. Exam Physical Exam   Monitoring:  NST reactive.  Irregular mild UCs.  HPP:  Patient Active Problem List   Diagnosis Date Noted  . Active labor 05/06/2015  . Labor abnormal 02/11/2014  . Postpartum care following vaginal delivery (10/1) 02/11/2014    Prenatal labs: ABO, Rh: --/--/B POS (12/23 1530) Antibody: NEG (12/23 1530) Rubella: Immune RPR: Nonreactive (06/01 0000)  HBsAg:  NR HIV: Non-reactive (06/01 0000)  Genetic testing: wnl US anato: wnl 1 hr GTT: wnl GBS: Negative (11/29 0000)   Assessment/Plan: 39+ wks G2P1 with SROM.  FHR  cat 1.  Pitocin Induction.   Misty Chang 05/06/2015, 7:14 PM

## 2015-05-07 ENCOUNTER — Encounter (HOSPITAL_COMMUNITY): Payer: Self-pay | Admitting: Obstetrics and Gynecology

## 2015-05-07 LAB — RPR: RPR Ser Ql: NONREACTIVE

## 2015-05-07 LAB — CBC
HEMATOCRIT: 33.4 % — AB (ref 36.0–46.0)
HEMOGLOBIN: 11.5 g/dL — AB (ref 12.0–15.0)
MCH: 30.8 pg (ref 26.0–34.0)
MCHC: 34.4 g/dL (ref 30.0–36.0)
MCV: 89.5 fL (ref 78.0–100.0)
Platelets: 142 10*3/uL — ABNORMAL LOW (ref 150–400)
RBC: 3.73 MIL/uL — AB (ref 3.87–5.11)
RDW: 13.8 % (ref 11.5–15.5)
WBC: 11.2 10*3/uL — AB (ref 4.0–10.5)

## 2015-05-07 NOTE — Progress Notes (Signed)
PPD #1- SVD  Subjective:   Reports feeling well Tolerating po/ No nausea or vomiting Bleeding is light Pain controlled with Motrin Up ad lib / ambulatory / voiding without problems Newborn: brestfeeding-2 good feeds so far  Objective:   VS:  VS:  Filed Vitals:   05/06/15 2005 05/06/15 2115 05/07/15 0113 05/07/15 0517  BP: 127/64 115/62 104/49 101/52  Pulse: 72 68 77 74  Temp: 97.6 F (36.4 C) 98.2 F (36.8 C) 98.5 F (36.9 C) 97.7 F (36.5 C)  TempSrc: Oral Oral    Resp: 18 18 20 18   Height:      Weight:        LABS:  Recent Labs  05/06/15 1530 05/07/15 0530  WBC 9.3 11.2*  HGB 13.3 11.5*  PLT 149* 142*   Blood type: --/--/B POS, B POS (12/23 1530) Rubella:     I&O: Intake/Output      12/23 0701 - 12/24 0700 12/24 0701 - 12/25 0700   Blood 350    Total Output 350     Net -350          Urine Occurrence 1 x      Physical Exam: Alert and oriented x3 Abdomen: soft, non-tender, non-distended  Fundus: firm, non-tender, U-2 Perineum: Well approximated, no significant erythema, edema, or drainage; healing well. Lochia: small Extremities: No edema, no calf pain or tenderness   Assessment:  PPD #1 G2P2002/ S/P:spontaneous vaginal, 2nd degree laceration A1GDM, delivered  Doing well   Plan: Continue routine post partum orders Anticipate D/C home tomorrow   Donette LarryBHAMBRI, Trinisha Paget, N MSN, CNM 05/07/2015, 10:31 AM

## 2015-05-07 NOTE — Lactation Note (Signed)
This note was copied from the chart of Boy Anelise Blackshire. Lactation Consultation Note  Initial visit made.  Breastfeeding consultation services and support information given to patient.  She has a 6114 month old at home and breastfed for 1 month.  She has a history of PCOS per chart.  Visitors arrived just prior to visit so LC will follow up later.  Baby has been very sleepy this AM.  She states she had baby skin to skin one hour ago after bath but baby showed no interest in feeding.  Instructed to watch for feeding cues and call for assist prn.  Patient Name: Boy Dahlia BailiffFarhat Moroney ZOXWR'UToday's Date: 05/07/2015 Reason for consult: Initial assessment   Maternal Data Formula Feeding for Exclusion: No Does the patient have breastfeeding experience prior to this delivery?: Yes  Feeding    LATCH Score/Interventions                      Lactation Tools Discussed/Used     Consult Status Consult Status: Follow-up Date: 05/08/15 Follow-up type: In-patient    Huston FoleyMOULDEN, Yadhira Mckneely S 05/07/2015, 12:28 PM

## 2015-05-07 NOTE — Clinical Social Work Maternal (Signed)
CLINICAL SOCIAL WORK MATERNAL/CHILD NOTE  Patient Details  Name: Misty Chang MRN: [id number] Date of Birth: 1975/11/06  Date:  05/07/2015  Clinical Social Worker Initiating Note:  Loleta Books MSW, LCSW Date/ Time Initiated:  05/07/15/0845     Child's Name:  Immanuel Angola   Legal Guardian: Eugenie Norrie and Royston Cowper Baehr  Need for Interpreter:  None   Date of Referral:  05/06/15     Reason for Referral: History of postpartum depression and depression at 62 weeks  Referral Source:  [facility]   Address:  [address] [place], [place] [address]  Phone number:  [phone]   Household Members:  Minor Children, Spouse   Natural Supports (not living in the home):  Friends, Extended Family, Immediate Family   Professional Supports: None   Employment: Homemaker   Type of Work:   N/A  Education:    N/A  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 Per chart review, MOB reported depression at 32 weeks, but declined medication. She stated that she preferred to "pray" and "stay positive".    MOB provided consent for CSW to complete assessment in presence of MOB.  MOB presented in a pleasant mood and displayed a full range in affect. No symptoms of depression noted.  MOB presented as a limited historian. She originally confirmed history of PPD, but then later stated that she is unsure of the symptoms she experienced or who informed her that she was experiencing  PPD.  CSW reviewed common signs and symptoms, but MOB did not identify with any symptoms.  CSW inquired about depression noted at [redacted] weeks gestation of this pregnancy, and MOB again denied any symptoms.  CSW again provided education on perinatal mood and anxiety disorders, and encouraged MOB to discuss concerns with her support and to engage in self-care activities.  MOB agreed to follow up with her medical provider if she notes concerns or symptoms, and requested CSW contact information in order to follow up with CSW if she begins to wonder about how she is feeling.  CSW provided. CSW normalized range of emotions associated with transition to postpartum, including feelings common with caring for two children who are close together in age.  Per MOB, she feels well supported by the FOB and her friends. The MOB and FOB stated that they do not have family that lives nearby, but shared that they have create a support system with friends. FOB stated that he is excited to transition to caring for two children, and shared that he has time off from work in order to assist the MOB transition home.    MOB and FOB denied questions, concerns, or needs at this time. Appreciation expressed for the visit, acknowledged CSW availability, and agreed to contact CSW if needs arise.  CSW Plan/Description:   1. Patient/Family Education-- perinatal mood and anxiety disorders  2. Information/Referral to [facility]-- Feelings After Birth support group  3. No Further Intervention Required/No Barriers  to Discharge    Kelby FamVenning, Cheyeanne Roadcap N, LCSW 05/07/2015, 9:08 AM

## 2015-05-08 DIAGNOSIS — O24410 Gestational diabetes mellitus in pregnancy, diet controlled: Secondary | ICD-10-CM | POA: Diagnosis present

## 2015-05-08 MED ORDER — IBUPROFEN 600 MG PO TABS: 600.0000 mg | ORAL_TABLET | Freq: Four times a day (QID) | ORAL | Status: AC

## 2015-05-08 NOTE — Lactation Note (Signed)
This note was copied from the chart of [name]. Lactation Consultation Note  Patient Name: [name] EAVWU'JToday's Date: 05/08/2015 Reason for consult: Follow-up assessment  39 hours old,per mom baby recently breast fed.  6% weight loss, Latch scores - 6-7-8-7, Breast feeding range 15 -45 mins. Voids and stools adequate for age . Bili check at 25 hours 7.  Sore nipples and engorgement prevention and tx reviewed. Per mom has a DEBP at home.  LC mentioned Barb Carder , LC with Dr. Ewell PoeAnderson's group and the Dr. Stann MainlandWill probably arrange appt.  Per mom saw her with her 1st baby. Mother informed of post-discharge support and given phone number to the lactation department, including services for phone call assistance; out-patient appointments; and breastfeeding support group. List of other breastfeeding resources in the community given in the handout. Encouraged mother to call for problems or concerns related to breastfeeding.   Maternal Data Has patient been taught Hand Expression?:  (per mom familiar with hand expressing )  Feeding Feeding Type:  (per mom baby recently breast fed )  LATCH Score/Interventions ( this latch score [facility] by the [facility] )  Latch: Grasps breast easily, tongue down, lips flanged, rhythmical sucking.  Audible Swallowing: A few with stimulation Intervention(s): Hand expression  Type of Nipple: Everted at rest and after stimulation  Comfort (Breast/Nipple): Soft / non-tender     Hold (Positioning): Assistance needed to correctly position infant at breast and maintain latch. Intervention(s): Breastfeeding basics reviewed  LATCH Score: 8  Lactation Tools Discussed/Used     Consult Status Consult Status: Complete Date: 05/08/15    Kathrin Greathouseorio, Marquitta Persichetti Ann 05/08/2015, 9:53 AM

## 2015-05-08 NOTE — Discharge Summary (Signed)
Obstetric Discharge Summary Reason for Admission: rupture of membranes and 39.[redacted] weeks gestation Prenatal Course: AMA, A1GDM Intrapartum Procedures: spontaneous vaginal delivery and Pitocin augmentation Postpartum Procedures: none Complications-Operative and Postpartum: 2nd degree perineal laceration HEMOGLOBIN  Date Value Ref Range Status  05/07/2015 11.5* 12.0 - 15.0 g/dL Final   HCT  Date Value Ref Range Status  05/07/2015 33.4* 36.0 - 46.0 % Final    Physical Exam:  General: alert, cooperative and no distress Lochia: appropriate Uterine Fundus: firm Incision: healing well, no significant drainage, no dehiscence, no significant erythema DVT Evaluation: No evidence of DVT seen on physical exam. Negative Homan's sign. No cords or calf tenderness. No significant calf/ankle edema.  Discharge Diagnoses: Term Pregnancy-delivered  Discharge Information: Date: 05/08/2015 Activity: pelvic rest Diet: routine Medications: PNV and Ibuprofen Condition: stable Instructions: refer to practice specific booklet Discharge to: home Follow-up Information    Follow up with MODY,VAISHALI R, MD. Schedule an appointment as soon as possible for a visit in 6 weeks.   Specialty:  Obstetrics and Gynecology   Contact information:   Enis Gash[address] [place] [id number] [phone]       Newborn Data: Live born female on 05/06/15 Birth Weight: 7 lb 2.6 oz (3250 g) APGAR: 8, 9  Home with mother.  Misty Chang, N 05/08/2015, 11:28 AM

## 2015-05-08 NOTE — Progress Notes (Signed)
PPD #2- SVD  Subjective:   Reports feeling well, more sleep last night Tolerating po/ No nausea or vomiting Bleeding is light Pain controlled with Motrin Up ad lib / ambulatory / voiding without problems Newborn: breastfeeding-going better / Circumcision: requests today  Objective:   VS: VS:  Filed Vitals:   05/07/15 0517 05/07/15 0900 05/07/15 1745 05/08/15 0521  BP: 101/52 104/63 103/69 97/58  Pulse: 74 71 77 68  Temp: 97.7 F (36.5 C) 98.3 F (36.8 C) 98.5 F (36.9 C) 97.9 F (36.6 C)  TempSrc:  Oral Oral Oral  Resp: 18 16 18 19   Height:      Weight:        LABS:  Recent Labs  05/06/15 1530 05/07/15 0530  WBC 9.3 11.2*  HGB 13.3 11.5*  PLT 149* 142*   Blood type: --/--/B POS, B POS (12/23 1530) Rubella:   Immune               I&O: Intake/Output      12/24 0701 - 12/25 0700 12/25 0701 - 12/26 0700   Blood     Total Output       Net              Physical Exam: Alert and oriented X3 Abdomen: soft, non-tender, non-distended  Fundus: firm, non-tender, U-2 Perineum: Well approximated, no significant erythema, edema, or drainage; healing well. Lochia: small Extremities: No edema, no calf pain or tenderness   Assessment: PPD #2  G2P2002/ S/P:spontaneous vaginal, 2nd degree laceration A1GDM, delivered Doing well - stable for discharge home  Plan: Discharge home RX's:  Ibuprofen 600mg  po Q 6 hrs prn pain #30 Refill x 0 Follow up in 6 wks at [facility] for postpartum visit [facility] booklet given    Donette LarryBHAMBRI, Misty Chang, N MSN, CNM 05/08/2015, 11:26 AM

## 2016-11-13 ENCOUNTER — Other Ambulatory Visit (HOSPITAL_COMMUNITY): Payer: Self-pay | Admitting: Obstetrics & Gynecology

## 2016-11-13 DIAGNOSIS — O283 Abnormal ultrasonic finding on antenatal screening of mother: Secondary | ICD-10-CM

## 2016-11-13 DIAGNOSIS — Z3689 Encounter for other specified antenatal screening: Secondary | ICD-10-CM

## 2016-11-13 DIAGNOSIS — Z3A19 19 weeks gestation of pregnancy: Secondary | ICD-10-CM

## 2016-11-16 ENCOUNTER — Encounter (HOSPITAL_COMMUNITY): Payer: Self-pay | Admitting: *Deleted

## 2016-11-20 ENCOUNTER — Ambulatory Visit (HOSPITAL_COMMUNITY)
Admission: RE | Admit: 2016-11-20 | Discharge: 2016-11-20 | Disposition: A | Discharge status: Home / Self Care | Payer: Medicaid Other | Source: Ambulatory Visit | Attending: Obstetrics & Gynecology | Admitting: Obstetrics & Gynecology

## 2016-11-20 ENCOUNTER — Other Ambulatory Visit (HOSPITAL_COMMUNITY): Payer: Self-pay | Admitting: Obstetrics & Gynecology

## 2016-11-20 ENCOUNTER — Encounter (HOSPITAL_COMMUNITY): Payer: Self-pay

## 2016-11-20 DIAGNOSIS — Z3689 Encounter for other specified antenatal screening: Secondary | ICD-10-CM

## 2016-11-20 DIAGNOSIS — O09522 Supervision of elderly multigravida, second trimester: Secondary | ICD-10-CM

## 2016-11-20 DIAGNOSIS — O359XX0 Maternal care for (suspected) fetal abnormality and damage, unspecified, not applicable or unspecified: Secondary | ICD-10-CM

## 2016-11-20 DIAGNOSIS — Z362 Encounter for other antenatal screening follow-up: Secondary | ICD-10-CM | POA: Diagnosis present

## 2016-11-20 DIAGNOSIS — Z3A19 19 weeks gestation of pregnancy: Secondary | ICD-10-CM

## 2016-11-20 DIAGNOSIS — O358XX0 Maternal care for other (suspected) fetal abnormality and damage, not applicable or unspecified: Secondary | ICD-10-CM

## 2016-11-20 DIAGNOSIS — O283 Abnormal ultrasonic finding on antenatal screening of mother: Secondary | ICD-10-CM

## 2016-11-20 NOTE — Progress Notes (Signed)
Genetic Counseling  High-Risk Gestation Note  Appointment Date:  11/20/2016 Referred By: Azucena Fallen, MD Date of Birth:  1976/04/17 Partner:  Misty Chang   Pregnancy History: [id number] Estimated Date of Delivery: 04/12/17 Estimated Gestational Age: 65w4dAttending: [place] MD  I met with [name] and her husband, Mr. Misty Epleyfor genetic counseling because of abnormal ultrasound findings.  In summary:  Discussed ultrasound findings in detail  Constellation of findings suggestive of underlying chromosome condition, specifically Trisomy 145or Trisomy 13  Discussed other possible etiologies could include other chromosome aberrations, single gene conditions, multifactorial  Reviewed options for additional screening  NIPS- elected to pursue Panorama today  Ongoing ultrasound- declined  Reviewed options for diagnostic testing, including risks, benefits, limitations and alternatives  Amniocentesis declined  Discussed option of continuation vs. Termination of pregnancy  Couple plans to pursue termination of pregnancy  [facility] pregnancy options clinic  [name] was sent for ultrasound and consultation today regarding abnormal ultrasound findings visualized by ultrasound at her primary OB office.  Ultrasound today revealed bilateral choroid plexus cysts, cleft lip and palate, abnormal fetal heart, clenched fingers, rocker bottom feet. Cavum not well visualized. Complete ultrasound results under separate cover.    We discussed these findings in detail.  Specifically, we discussed that congenital anomalies can occur as isolated, nonsyndromic birth defects, or as features of an underlying genetic syndrome.  The risk for a genetic etiology increases with the presence of multiple fetal anatomic differences.  Based on the combination of ultrasound findings, and considering Misty Chang's age related risk for fetal  aneuploidy, we discussed the strong suspicion for Trisomy 142or Trisomy 13 in the current pregnancy.  We reviewed chromosomes, nondisjunction, and the common features of Down syndrome, trisomy 159 and trisomy 175 We spent time discussing the poor prognosis associated with Trisomy 18 and Trisomy 13.   In addition, we discussed the risk for other chromosome aberrations including microdeletions (22q11 del syndrome), duplications, insertions, and translocations.     Misty Chang was then counseled regarding the availability of amniocentesis including the associated risks, benefits, and limitations.  She understands that chromosome analysis can be performed both prenatally (amniotic fluid) and postnatally (peripheral blood, cord blood, products of conception).  Additionally, we discussed the availability of microarray analysis, which can also be performed pre and postnatally.    They were then counseled regarding the option of noninvasive prenatal screening (NIPS).  We reviewed that this technology evaluates fragments of fetal (placental) DNA found in maternal circulation to predict the chance of specific chromosome conditions in the fetus.  Although highly specific and sensitive, this testing is not considered diagnostic.  We reviewed that NIPS specifically assesses the risk of fetal Down syndrome, trisomy 120 trisomy 144 fetal sex chromosome aneuploidy, triploidy, and select microdeletions (22q11 deletion syndrome); currently, this technology cannot assess the risk for all chromosome aberrations or single gene conditions. We discussed the possible results that the tests might provide including: positive, negative, unanticipated, and no result. Finally, they were counseled regarding the cost of each option and potential out of pocket expenses. After thoughtful consideration of their options, [name] to [facility] but declined am[facility]ocentesis today.    We then discussed other  possible explanations for the above discussed ultrasound findings including single gene conditions.  They were counseled that single gene conditions are typically tested for postnatally, based on the recommendation of a medical geneticist,  unless ultrasound findings or the family history are strongly suggestive of a specific syndrome.  We discussed that multiple congenital anomalies can also result from teratogenic exposures.  Misty Chang denied the use of illicit substances, medications, or alcohol during this pregnancy.  Misty Chang was counseled that the prognosis and postnatal management depend on the underlying etiology of the fetal differences. They were counseled that the prognosis is expected to be poor based on the multiple anomalies.  We then discussed the option of serial sonography, fetal echocardiogram, and a postnatal consultation with a medical geneticist, if warranted. We also discussed the option of continuing the pregnancy versus termination of pregnancy.  We discussed that pregnancy termination is a legal option in [place] until [redacted] weeks gestation.  We discussed the options of D&E and induction.  This couple expressed that they plan to proceed with termination of pregnancy and are understandably upset by the information discussed today. Consultation is scheduled for the patient Wednesday, 11/21/16 with [facility] in the pregnancy options clinic to review their questions in more detail and facilitate termination of pregnancy, if desired.   Detailed pedigree not constructed during today's visit given the nature of today's discussion. However, the couple reported no known family history of birth defects, intellectual disability, recurrent pregnancy loss, or known genetic conditions. Consanguinity was denied.   Misty Chang denied exposure to environmental toxins or chemical agents. She denied the use of alcohol, tobacco or street drugs. She denied significant viral illnesses during the  course of her pregnancy. Her medical and surgical histories were noncontributory.   I counseled this couple regarding the above risks and available options.  The approximate face-to-face time with the genetic counselor was 45 minutes.  Chipper Oman, MS,  Certified Genetic Counselor 11/20/2016

## 2016-11-29 ENCOUNTER — Encounter (HOSPITAL_COMMUNITY): Payer: Self-pay | Admitting: MS"

## 2016-11-29 ENCOUNTER — Telehealth (HOSPITAL_COMMUNITY): Payer: Self-pay | Admitting: MS"

## 2016-11-29 DIAGNOSIS — O358XX0 Maternal care for other (suspected) fetal abnormality and damage, not applicable or unspecified: Secondary | ICD-10-CM

## 2016-11-29 DIAGNOSIS — O359XX0 Maternal care for (suspected) fetal abnormality and damage, unspecified, not applicable or unspecified: Secondary | ICD-10-CM

## 2016-11-29 NOTE — Telephone Encounter (Signed)
Attempted to contact patient regarding results of noninvasive prenatal screening (NIPS)/Panorama. No answer on patient's phone, and unable to leave a message given that the phone continued to ring without going to voicemail.   Misty Chang  11/29/2016 3:37 PM

## 2016-11-30 ENCOUNTER — Telehealth (HOSPITAL_COMMUNITY): Payer: Self-pay | Admitting: MS"

## 2016-11-30 NOTE — Telephone Encounter (Signed)
Attempted to contact patient regarding results of noninvasive prenatal screening, Panorama. Left message for patient to return call.   Clydie BraunKaren Devaun Hernandez 11/30/2016 11:45 AM

## 2016-12-03 ENCOUNTER — Other Ambulatory Visit (HOSPITAL_COMMUNITY): Payer: Self-pay

## 2019-09-22 ENCOUNTER — Ambulatory Visit: Payer: BLUE CROSS/BLUE SHIELD | Attending: Internal Medicine

## 2019-09-22 DIAGNOSIS — Z23 Encounter for immunization: Secondary | ICD-10-CM

## 2019-09-22 NOTE — Progress Notes (Signed)
   Covid-19 Vaccination Clinic  Name:  Misty Chang    MRN: [id number] DOB: 09/15/1975  09/22/2019  [name] was observed post Covid-19 immunization for 15 minutes without incident. She was provided with Vaccine Information Sheet and instruction to access the V-Safe system.   [name] was instructed to call 911 with any severe reactions post vaccine: Marland Kitchen Difficulty breathing  . Swelling of face and throat  . A fast heartbeat  . A bad rash all over body  . Dizziness and weakness   Immunizations Administered    Name Date Dose VIS Date Route   Pfizer COVID-19 Vaccine 09/22/2019  4:29 PM 0.3 mL 07/08/2018 Intramuscular   Manufacturer: [employer], [employer]   Lot: [id number]   NDC: [address]-4

## 2019-10-13 ENCOUNTER — Ambulatory Visit: Payer: BLUE CROSS/BLUE SHIELD | Attending: Internal Medicine

## 2019-10-13 DIAGNOSIS — Z23 Encounter for immunization: Secondary | ICD-10-CM

## 2019-10-13 NOTE — Progress Notes (Signed)
   Covid-19 Vaccination Clinic  Name:  Misty Chang    MRN: [id number] DOB: 1976-04-09  10/13/2019  [name] was observed post Covid-19 immunization for 15 minutes without incident. She was provided with Vaccine Information Sheet and instruction to access the V-Safe system.   [name] was instructed to call 911 with any severe reactions post vaccine: Marland Kitchen Difficulty breathing  . Swelling of face and throat  . A fast heartbeat  . A bad rash all over body  . Dizziness and weakness   Immunizations Administered    Name Date Dose VIS Date Route   Pfizer COVID-19 Vaccine 10/13/2019  4:36 PM 0.3 mL 07/08/2018 Intramuscular   Manufacturer: [employer], [employer]   Lot: [id number]   NDC: [address]-2
